# Patient Record
Sex: Male | Born: 2005 | Race: Black or African American | Hispanic: No | Marital: Single | Smoking: Current every day smoker
Health system: Southern US, Community
[De-identification: ages and names within clinical notes are randomized; demographics above are authoritative.]

## PROBLEM LIST (undated history)

## (undated) DIAGNOSIS — S83512S Sprain of anterior cruciate ligament of left knee, sequela: Secondary | ICD-10-CM

## (undated) HISTORY — PX: INGUINAL HERNIA REPAIR: SUR1180

---
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
## 2023-11-19 ENCOUNTER — Ambulatory Visit (HOSPITAL_COMMUNITY)
Admission: RE | Admit: 2023-11-19 | Discharge: 2023-11-19 | Disposition: A | Discharge status: Home / Self Care | Payer: Medicaid Other | Source: Ambulatory Visit | Attending: Emergency Medicine | Admitting: Emergency Medicine

## 2023-11-19 ENCOUNTER — Other Ambulatory Visit: Payer: Self-pay

## 2023-11-19 ENCOUNTER — Encounter (HOSPITAL_COMMUNITY): Payer: Self-pay

## 2023-11-19 VITALS — BP 124/75 | HR 79 | Temp 98.1°F | Resp 16

## 2023-11-19 DIAGNOSIS — Z113 Encounter for screening for infections with a predominantly sexual mode of transmission: Secondary | ICD-10-CM | POA: Diagnosis present

## 2023-11-19 LAB — HIV ANTIBODY (ROUTINE TESTING W REFLEX): HIV Screen 4th Generation wRfx: NONREACTIVE

## 2023-11-19 NOTE — Discharge Instructions (Signed)
Your results were returned over the next few days and someone will call you if results are positive and require treatment.  Return here as needed.
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
## 2023-11-20 LAB — RPR: RPR Ser Ql: NONREACTIVE

## 2023-11-22 ENCOUNTER — Telehealth (HOSPITAL_COMMUNITY): Payer: Self-pay

## 2023-11-22 LAB — CYTOLOGY, (ORAL, ANAL, URETHRAL) ANCILLARY ONLY
Chlamydia: NEGATIVE
Comment: NEGATIVE
Comment: NEGATIVE
Comment: NORMAL
Neisseria Gonorrhea: NEGATIVE
Trichomonas: POSITIVE — AB

## 2023-11-22 MED ORDER — METRONIDAZOLE 500 MG PO TABS: 2000.0000 mg | ORAL_TABLET | Freq: Once | ORAL | 0 refills | Status: AC

## 2023-11-22 NOTE — Telephone Encounter (Signed)
 Per protocol, pt requires tx with metronidazole. Attempted to reach patient x1. LVM.  Rx sent to pharmacy on file.

## 2024-05-02 ENCOUNTER — Ambulatory Visit (HOSPITAL_COMMUNITY): Admission: EM | Admit: 2024-05-02 | Discharge: 2024-05-03 | Attending: Psychiatry | Admitting: Psychiatry

## 2024-05-02 DIAGNOSIS — F913 Oppositional defiant disorder: Secondary | ICD-10-CM | POA: Insufficient documentation

## 2024-05-02 DIAGNOSIS — R4689 Other symptoms and signs involving appearance and behavior: Secondary | ICD-10-CM

## 2024-05-02 DIAGNOSIS — R45850 Homicidal ideations: Secondary | ICD-10-CM | POA: Insufficient documentation

## 2024-05-02 DIAGNOSIS — Z9151 Personal history of suicidal behavior: Secondary | ICD-10-CM | POA: Insufficient documentation

## 2024-05-02 DIAGNOSIS — F32A Depression, unspecified: Secondary | ICD-10-CM | POA: Diagnosis not present

## 2024-05-02 DIAGNOSIS — F29 Unspecified psychosis not due to a substance or known physiological condition: Secondary | ICD-10-CM | POA: Insufficient documentation

## 2024-05-02 DIAGNOSIS — F1290 Cannabis use, unspecified, uncomplicated: Secondary | ICD-10-CM | POA: Insufficient documentation

## 2024-05-02 DIAGNOSIS — R451 Restlessness and agitation: Secondary | ICD-10-CM | POA: Insufficient documentation

## 2024-05-02 DIAGNOSIS — R454 Irritability and anger: Secondary | ICD-10-CM | POA: Insufficient documentation

## 2024-05-02 DIAGNOSIS — F909 Attention-deficit hyperactivity disorder, unspecified type: Secondary | ICD-10-CM | POA: Diagnosis not present

## 2024-05-02 LAB — POCT URINE DRUG SCREEN - MANUAL ENTRY (I-SCREEN)
POC Amphetamine UR: NOT DETECTED
POC Buprenorphine (BUP): NOT DETECTED
POC Cocaine UR: NOT DETECTED
POC Marijuana UR: POSITIVE — AB
POC Methadone UR: NOT DETECTED
POC Methamphetamine UR: NOT DETECTED
POC Morphine: NOT DETECTED
POC Oxazepam (BZO): NOT DETECTED
POC Oxycodone UR: NOT DETECTED
POC Secobarbital (BAR): NOT DETECTED

## 2024-05-02 LAB — URINALYSIS, ROUTINE W REFLEX MICROSCOPIC
Bacteria, UA: NONE SEEN
Bilirubin Urine: NEGATIVE
Glucose, UA: NEGATIVE mg/dL
Ketones, ur: NEGATIVE mg/dL
Leukocytes,Ua: NEGATIVE
Nitrite: NEGATIVE
Protein, ur: NEGATIVE mg/dL
Specific Gravity, Urine: 1.005 (ref 1.005–1.030)
pH: 7 (ref 5.0–8.0)

## 2024-05-02 MED ORDER — DIPHENHYDRAMINE HCL 50 MG/ML IJ SOLN
50.0000 mg | Freq: Three times a day (TID) | INTRAMUSCULAR | Status: DC | PRN
  Administered 2024-05-02: 50 mg via INTRAMUSCULAR
  Filled 2024-05-02: qty 1

## 2024-05-02 MED ORDER — ALUM & MAG HYDROXIDE-SIMETH 200-200-20 MG/5ML PO SUSP: 30.0000 mL | ORAL | Status: DC | PRN

## 2024-05-02 MED ORDER — LORAZEPAM 2 MG/ML IJ SOLN
2.0000 mg | Freq: Three times a day (TID) | INTRAMUSCULAR | Status: DC | PRN
  Filled 2024-05-02: qty 1

## 2024-05-02 MED ORDER — ACETAMINOPHEN 325 MG PO TABS: 650.0000 mg | ORAL_TABLET | Freq: Four times a day (QID) | ORAL | Status: DC | PRN

## 2024-05-02 MED ORDER — MAGNESIUM HYDROXIDE 400 MG/5ML PO SUSP: 30.0000 mL | Freq: Every day | ORAL | Status: DC | PRN

## 2024-05-02 MED ORDER — LORAZEPAM 2 MG/ML IJ SOLN
2.0000 mg | Freq: Three times a day (TID) | INTRAMUSCULAR | Status: DC | PRN
  Administered 2024-05-02: 2 mg via INTRAMUSCULAR

## 2024-05-02 MED ORDER — DIPHENHYDRAMINE HCL 50 MG PO CAPS: 50.0000 mg | ORAL_CAPSULE | Freq: Three times a day (TID) | ORAL | Status: DC | PRN

## 2024-05-02 MED ORDER — HALOPERIDOL LACTATE 5 MG/ML IJ SOLN
10.0000 mg | Freq: Three times a day (TID) | INTRAMUSCULAR | Status: DC | PRN
  Administered 2024-05-02: 10 mg via INTRAMUSCULAR
  Filled 2024-05-02: qty 2

## 2024-05-02 MED ORDER — OLANZAPINE 5 MG PO TABS
5.0000 mg | ORAL_TABLET | Freq: Every day | ORAL | Status: DC
Start: 2024-05-02 — End: 2024-05-03
  Administered 2024-05-02: 5 mg via ORAL
  Filled 2024-05-02: qty 1

## 2024-05-02 MED ORDER — DIPHENHYDRAMINE HCL 50 MG/ML IJ SOLN: 50.0000 mg | Freq: Three times a day (TID) | INTRAMUSCULAR | Status: DC | PRN

## 2024-05-02 MED ORDER — HALOPERIDOL 5 MG PO TABS
5.0000 mg | ORAL_TABLET | Freq: Three times a day (TID) | ORAL | Status: DC | PRN
  Administered 2024-05-03: 5 mg via ORAL
  Filled 2024-05-02: qty 1

## 2024-05-02 MED ORDER — HALOPERIDOL LACTATE 5 MG/ML IJ SOLN: 5.0000 mg | Freq: Three times a day (TID) | INTRAMUSCULAR | Status: DC | PRN

## 2024-05-02 NOTE — Progress Notes (Signed)
 Inpatient Psychiatric Referral  Patient was recommended inpatient per Jasmine Levy, NP. There are no available beds at [facility], per [facility] Cherylynn Ernst, RN. Patient was referred to the following out of network facilities:  Destination  Service Provider Request Status Address Phone Fax  [facility]  Pending - Request Sent [address], [place] [address] [phone]  [facility]  Pending - Request Sent [address] [place] [address] [phone]  [facility]  Pending - Request Sent [address] [place] [address] [phone]  [facility]  Pending - Request Sent [address], [place] [address] [phone]  [facility]  Pending - Request Sent [address] [place] [address] [phone]  [facility]  Pending - Request Sent [address] [place] [phone]  [facility]  Pending - Request Sent [address] [place] [address] [phone]  [facility]  Pending - Request Sent [address], [place] [address] [phone]    Situation ongoing, CSW to continue following and update chart as more information becomes available.   Harrie Sofia MSW, LCSWA 05/02/2024  6:09 PM

## 2024-05-02 NOTE — Progress Notes (Signed)
 Pt has been accepted to [facility] on 05/02/2024 Bed assignment: Main campus  Pt meets inpatient criteria per Rolin Lipps, NP   Attending Physician will be Millie Manners, MD  Report can be called to: [phone] (this is a pager, please leave call-back number when giving report)  Pt can arrive after 8 AM  Care Team Notified:  Orene Carte, RN

## 2024-05-02 NOTE — ED Provider Notes (Cosign Needed Addendum)
 [facility] Continuous Assessment Admission H&P  Date: 05/02/24 Patient Name: Dustin Carlson MRN: [id number] Chief Complaint: Per triage note: Dustin Carlson presents to [facility] voluntarily accompanied by his mother. Pt states that his mother feels that he needs to talk to someone. Pt states that he has been breaking stuff in the home because they be getting on his nerves. Pt currently denies SI, AVH and alcohol/drug use. Pt shares that he had SI thoughts 2 months ago, and sometimes still thinks about it. Pt shares he attempted SI by cutting himself in 2023. Pt states that he thinks about HI everyday and still endorses HI at this time. Pt shares that his last drink was July 1 this year.   Diagnoses:  Final diagnoses:  Psychosis, unspecified psychosis type (HCC)  Aggressive behavior of adult    HPI: Dustin Carlson, 18 y.o., male  with a reported history of ADHD, ODD and MDD , presented to [facility] voluntarily as a walk-in accompanied by his mother, [name], with complaints of increased agitation and aggression. Patient seen face to face by this provider, consulted with Dr. Cole; and chart reviewed on 05/02/24. Patient assessment was unachievable as patient was unable to participate in the process due to significant agitation and aggression. Upon evaluation, patient was verbally aggressive to staff by use of profanity and threatening statements. Patient presents are irritable and agitated, mood congruent with affect. He presents with poor eye contact and is speaking and yelling at a loud volume. Unable to assess suicidal ideation, self-harm, homicidal ideation, auditory hallucinations, and/or visual hallucinations. Objectively, it appears that patient may be responding to internal stimuli as evidenced by self-dialogue at a minimal volume.   Collateral obtained via mother, [name], during in person assessment: Per patient's mother, she reports that she has  noticed patient has become increasingly aggressive and agitated over the last few weeks and feels this is the breaking point. She reports anger outbursts have increased in frequency (previously about once ever 3 months and is now multiple times per week). She reports patient experiences anger outbursts that are disproportionate to the stressor. She reports an incident occurred this past weekend in which patient became upset regarding his laundry and this resulted in him throwing fans and breaking multiple televisions in the home. Per mother, patient laughed inappropriately after and appeared to show no remorse for his actions. She also reports patient presents as invincible, untouchable, and like he runs the world. She reports patient's brother was recently incarcerated about a week ago and this has been a major stressor for patient as they are very close and have never been apart. She reports patient has reported hearing voices in the past, but did not disclose the content. Per mother, she reports patient has been sleeping poorly and staying up all night talking to himself. She reports past history of visual hallucinations, unaware of content. Mother unaware of suicidal ideation or attempts, however per triage note, patient reports passive SI and suicide attempt via cutting in 2023. Mother also reports paranoid delusions, as patient believes the family and others are conspiring against him and trying to harm him. She reports patient voiced concern that someone was trying to harm him while driving on the way here and stated I'll jump out of this car right now.  She reports that patient has a history of behavioral concerns since childhood (tearing up the class, breaking things, scratching his face, eyes rolling in his head) and difficulty  controlling his anger. She reports he received a diagnosis of ADHD, ODD and depression and was prescribed Zoloft (unsure of dosage) around age [age] years old, but  discontinued due to increased agitation, anger and aggression. Mother also reports a previous psychiatric provider mentioned early stage schizophrenia. Per mother, there is a strong family history of schizophrenia and bipolar disorder. Regarding substance use, mother reports patient smokes cannabis frequently. She reports patient does not have any medical conditions or concerns. Mother denies physical aggression towards others, endorses aggression towards property. She reports patient has access to firearms in the home.   Total Time spent with patient: 1.5 hours  Musculoskeletal  Strength & Muscle Tone: within normal limits Gait & Station: normal Patient leans: N/A  Psychiatric Specialty Exam  Presentation General Appearance: Fairly Groomed  Eye Contact:Poor  Speech:Pressured  Speech Volume:Increased   Mood and Affect  Mood:Angry; Irritable  Affect:Congruent; Labile   Thought Process  Thought Processes:Other (comment) (UTA)  Descriptions of Associations:-- (UTA)  Orientation:Other (comment) (UTA)  Thought Content:Other (comment) (UTA)    Hallucinations:Hallucinations: Other (comment) (UTA)  Ideas of Reference:-- (UTA)  Suicidal Thoughts:Suicidal Thoughts: -- (UTA)  Homicidal Thoughts:Homicidal Thoughts: -- (UTA)   Sensorium  Memory:Other (comment) (UTA)  Judgment:Other (comment) (UTA)  Insight:Other (comment) (UTA)   Executive Functions  Concentration:Other (comment) (UTA)  Attention Span:Other (comment) (UTA)  Recall:Other (comment) (UTA)  Fund of Knowledge:Other (comment) (UTA)  Language:Other (comment) (UTA)   Psychomotor Activity  Psychomotor Activity:Psychomotor Activity: Increased; Restlessness; Other (comment) (agitation)   Assets  Assets:Social Support; Housing   Sleep  Sleep:Sleep: -- (UTA) Number of Hours of Sleep: -- (UTA)   Nutritional Assessment (For OBS and [facility] admissions only) Has the patient had a weight loss or gain of  10 pounds or more in the last 3 months?: -- (UTA) Has the patient had a decrease in food intake/or appetite?: -- (UTA) Does the patient have dental problems?: -- (UTA) Does the patient have eating habits or behaviors that may be indicators of an eating disorder including binging or inducing vomiting?: -- (UTA) Has the patient recently lost weight without trying?: -- (UTA) Has the patient been eating poorly because of a decreased appetite?: -- (UTA)    Physical Exam Musculoskeletal:        General: Normal range of motion.  Skin:    General: Skin is warm and dry.  Neurological:     Mental Status: He is alert.  Psychiatric:        Attention and Perception: He is inattentive.        Mood and Affect: Affect is labile and angry.        Behavior: Behavior is agitated, aggressive and combative.        Judgment: Judgment is impulsive and inappropriate.    Review of Systems  Constitutional: Negative.   Skin: Negative.     Blood pressure (!) 110/59, pulse 86, temperature 98.3 F (36.8 C), temperature source Oral, resp. rate 16, SpO2 100%. There is no height or weight on file to calculate BMI.  Past Psychiatric History: Prior psychiatric diagnoses: ADHD, ODD, MDD. Per patient's mother, no hx of psychiatric hospitalizations. Hx of treatment for MDD as a child. UTA suicide attempts or non-suicidal self injury. Per patient's mother, patient does not currently see a therapist or medication provider.   Is the patient at risk to self? No  Has the patient been a risk to self in the past 6 months? No .    Has the patient been a  risk to self within the distant past? Yes   Is the patient a risk to others? No  Has the patient been a risk to others in the past 6 months? No   Has the patient been a risk to others within the distant past? No   Past Medical History: None   Family History: Schizophrenia (maternal grandmother, maternal aunt), bipolar disorder (maternal aunt, maternal cousin)  Social  History: Lives at home with mother and siblings.    Last Labs:  Admission on 11/19/2023, Discharged on 11/19/2023  Component Date Value Ref Range Status   Neisseria Gonorrhea 11/19/2023 Negative   Final   Chlamydia 11/19/2023 Negative   Final   Trichomonas 11/19/2023 Positive (A)   Final   Comment 11/19/2023 Normal Reference Range Trichomonas - Negative   Final   Comment 11/19/2023 Normal Reference Ranger Chlamydia - Negative   Final   Comment 11/19/2023 Normal Reference Range Neisseria Gonorrhea - Negative   Final   HIV Screen 4th Generation wRfx 11/19/2023 Non Reactive  Non Reactive Final   Performed at [facility], [address] [place], [place] [address]   RPR Ser Ql 11/19/2023 NON REACTIVE  NON REACTIVE Final   Performed at [facility], [address] [place], [place] [address]    Allergies: Bee venom and Other  Medications:  Facility Ordered Medications  Medication   haloperidol  (HALDOL ) tablet 5 mg   And   diphenhydrAMINE  (BENADRYL ) capsule 50 mg   haloperidol  lactate (HALDOL ) injection 5 mg   And   diphenhydrAMINE  (BENADRYL ) injection 50 mg   And   LORazepam  (ATIVAN ) injection 2 mg   haloperidol  lactate (HALDOL ) injection 10 mg   And   diphenhydrAMINE  (BENADRYL ) injection 50 mg   And   LORazepam  (ATIVAN ) injection 2 mg   acetaminophen  (TYLENOL ) tablet 650 mg   alum & mag hydroxide-simeth (MAALOX/MYLANTA) 200-200-20 MG/5ML suspension 30 mL   magnesium  hydroxide (MILK OF MAGNESIA) suspension 30 mL     Medical Decision Making  Patient presents with symptoms concerning for psychosis and possible mania. Patient presented voluntarily, but was petitioned for involuntary commitment status. Awaiting response from magistrate.   PLAN:  --Admission: Patient meets criteria for inpatient treatment for mood stabilization, continuous monitoring, medication management and safety. Will notify [facility] and LCSW to locate appropriate placement. Patient will   --Medical Interventions:     -Labs: CBC, CMP, Lipid panel, HbA1c, TSH, Urine drug screen, Ethanol , and EKG --Psychiatric Interventions:      -Agitation Protocol     -Olanzapine  5 mg at bedtime to target symptoms of psychosis   Recommendations  Based on my evaluation the patient does not appear to have an emergency medical condition.  Zakayla Martinec A Iver Miklas, NP 05/02/24  4:11 PM

## 2024-05-02 NOTE — Progress Notes (Signed)
   05/02/24 1406  [facility] Triage Screening (Walk-ins at [facility] only)  How Did You Hear About Us ? Family/Friend  What Is the Reason for Your Visit/Call Today? Dustin Carlson presents to [facility] voluntarily accompanied by his mother. Pt states that his mother feels that he needs to talk to someone. Pt states that he has been breaking stuff in the home because they be getting on his nerves. Pt currently denies SI, AVH and alcohol/drug use. Pt shares that he had SI thoughts 2 months ago, and sometimes still thinks about it. Pt shares he attempted SI by cutting himself in 2023. Pt states that he thinks about HI everyday and still endorses HI at this time. Pt shares that his last drink was July 1 this year.  How Long Has This Been Causing You Problems? <Week  Have You Recently Had Any Thoughts About Hurting Yourself? No  Are You Planning to Commit Suicide/Harm Yourself At This time?  (sometimes)  Have you Recently Had Thoughts About Hurting Someone Sherral? Yes  How long ago did you have thoughts of harming others? everyday  Are You Planning To Harm Someone At This Time? Yes  Physical Abuse Denies  Verbal Abuse Denies  Sexual Abuse Denies  Exploitation of patient/patient's resources Denies  Are you currently experiencing any auditory, visual or other hallucinations? No  Have You Used Any Alcohol or Drugs in the Past 24 Hours? No  Do you have any current medical co-morbidities that require immediate attention? No  Clinician description of patient physical appearance/behavior: irritated, giggling  What Do You Feel Would Help You the Most Today? Social Support  If access to [facility] was not available, would you have sought care in the Emergency Department? No  Determination of Need Urgent (48 hours)  Options For Referral Medication Management;Inpatient Hospitalization;Outpatient Therapy  Determination of Need filed? Yes
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
## 2024-05-02 NOTE — ED Notes (Addendum)
 Pt BP 97/51. Gatorade and snack given to pt although he reports not being hungry or thirsty. He drank a full cup of water with his medication. NP notified and no new orders at this time. He is safe on the unit at this time with continuous observation in place.
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
## 2024-05-02 NOTE — ED Notes (Addendum)
 Pt is seen in bed eyes closed with unlabored breaths. UTA SI/HI/AVH at this time. He safe on the unit at this time with continuous observation in place.
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
## 2024-05-03 DIAGNOSIS — R4689 Other symptoms and signs involving appearance and behavior: Secondary | ICD-10-CM

## 2024-05-03 DIAGNOSIS — F29 Unspecified psychosis not due to a substance or known physiological condition: Secondary | ICD-10-CM

## 2024-05-03 NOTE — ED Notes (Signed)
 Patient resting quietly in bed with eyes closed. Unlabored breathing. NAD noted. Facility protocol safety checks in place. Pt is currently safe on the unit.

## 2024-05-03 NOTE — ED Provider Notes (Signed)
 [facility]BC/OBS ASAP Discharge Summary  Date and Time: 05/03/2024 3:44 PM  Name: Dustin Carlson  MRN:  [id number]   Discharge Diagnoses:  Final diagnoses:  Psychosis, unspecified psychosis type (HCC)  Aggressive behavior of adult    Subjective: Pt this morning was resting in bed. Sleep was alright, Anxiety rated 0/10, Depression 2/10. Denies SI, HI, and VH. Denies AH today saying he hears them sometimes when I'm by myself. Only complaint is that he doesn't want to be IVCd and he wants to go home.  Stay Summary: Pt arrived 05/02/2024 voluntarily with mother who brought him in for episodes of aggressive, destructive rage that have been getting more frequent to now occurring a few times a week. After arrival, pt became agitated, threw a table, and a physical hold was required to administer medication and pt was placed in a restraint chair. After removal from the restraint chair, pt voluntarily took medication and slept through the night. Pt was placed on IVC and did not like this, but was not aggressive toward staff in the morning and was taken to [facility] by sheriff.   Total Time spent with patient: 20 minutes  Past Psychiatric History: Prior psychiatric diagnoses: ADHD, ODD, MDD. Per patient's mother, no hx of psychiatric hospitalizations. Hx of treatment for MDD as a child. UTA suicide attempts or non-suicidal self injury. Per patient's mother, patient does not currently see a therapist or medication provider. Family Psychiatric History: Schizophrenia (maternal grandmother, maternal aunt), bipolar disorder (maternal aunt, maternal cousin) Social History: Lives at home with mother and siblings  Current Medications:  Current Facility-Administered Medications  Medication Dose Route Frequency Provider Last Rate Last Admin   acetaminophen  (TYLENOL ) tablet 650 mg  650 mg Oral Q6H PRN Levy, Jasmine A, NP       alum & mag hydroxide-simeth (MAALOX/MYLANTA) 200-200-20 MG/5ML suspension 30 mL  30 mL Oral Q4H PRN  Levy, Jasmine A, NP       haloperidol  (HALDOL ) tablet 5 mg  5 mg Oral TID PRN Levy, Jasmine A, NP   5 mg at 05/03/24 1045   And   diphenhydrAMINE  (BENADRYL ) capsule 50 mg  50 mg Oral TID PRN Levy, Jasmine A, NP       haloperidol  lactate (HALDOL ) injection 5 mg  5 mg Intramuscular TID PRN Levy, Jasmine A, NP       And   diphenhydrAMINE  (BENADRYL ) injection 50 mg  50 mg Intramuscular TID PRN Levy, Jasmine A, NP       And   LORazepam  (ATIVAN ) injection 2 mg  2 mg Intramuscular TID PRN Levy, Jasmine A, NP       haloperidol  lactate (HALDOL ) injection 10 mg  10 mg Intramuscular TID PRN Levy, Jasmine A, NP   10 mg at 05/02/24 1500   And   diphenhydrAMINE  (BENADRYL ) injection 50 mg  50 mg Intramuscular TID PRN Levy, Jasmine A, NP   50 mg at 05/02/24 1500   And   LORazepam  (ATIVAN ) injection 2 mg  2 mg Intramuscular TID PRN Levy, Jasmine A, NP   2 mg at 05/02/24 1500   magnesium  hydroxide (MILK OF MAGNESIA) suspension 30 mL  30 mL Oral Daily PRN Levy, Jasmine A, NP       OLANZapine  (ZYPREXA ) tablet 5 mg  5 mg Oral QHS Levy, Jasmine A, NP   5 mg at 05/02/24 2127   No current outpatient medications on file.    PTA Medications:  Facility Ordered Medications  Medication   haloperidol  (HALDOL ) tablet  5 mg   And   diphenhydrAMINE  (BENADRYL ) capsule 50 mg   haloperidol  lactate (HALDOL ) injection 5 mg   And   diphenhydrAMINE  (BENADRYL ) injection 50 mg   And   LORazepam  (ATIVAN ) injection 2 mg   haloperidol  lactate (HALDOL ) injection 10 mg   And   diphenhydrAMINE  (BENADRYL ) injection 50 mg   And   LORazepam  (ATIVAN ) injection 2 mg   acetaminophen  (TYLENOL ) tablet 650 mg   alum & mag hydroxide-simeth (MAALOX/MYLANTA) 200-200-20 MG/5ML suspension 30 mL   magnesium  hydroxide (MILK OF MAGNESIA) suspension 30 mL   OLANZapine  (ZYPREXA ) tablet 5 mg        No data to display          Flowsheet Row ED from 05/02/2024 in [facility] from 11/19/2023 in [facility] from 09/12/2022 in [facility] RISK CATEGORY No Risk No Risk No Risk    Musculoskeletal  Strength & Muscle Tone: within normal limits Gait & Station: normal Patient leans: N/A  Psychiatric Specialty Exam  Presentation  General Appearance:  Appropriate for Environment  Eye Contact: Fair  Speech: Normal Rate  Speech Volume: Decreased  Handedness:No data recorded  Mood and Affect  Mood: Euthymic  Affect: Blunt   Thought Process  Thought Processes: Goal Directed; Coherent; Linear  Descriptions of Associations:Intact  Orientation:Full (Time, Place and Person)  Thought Content:Logical  Diagnosis of Schizophrenia or Schizoaffective disorder in past: No (Mother reports he wa told when he wws younger that he ws displaying early signs of schiophrenia)  Duration of Psychotic Symptoms: Greater than six months   Hallucinations:Hallucinations: None  Ideas of Reference:None  Suicidal Thoughts:Suicidal Thoughts: No  Homicidal Thoughts:Homicidal Thoughts: No   Sensorium  Memory: Immediate Fair; Recent Fair; Remote Fair  Judgment: Poor  Insight: Poor   Executive Functions  Concentration: Fair  Attention Span: Other (comment) (UTA)  Recall: Other (comment) (UTA)  Fund of Knowledge: Other (comment) (UTA)  Language: Other (comment) (UTA)   Psychomotor Activity  Psychomotor Activity: Psychomotor Activity: Normal   Assets  Assets: Resilience   Sleep  Sleep: Sleep: -- (alright)  No Safety Checks orders active in given range  Nutritional Assessment (For OBS and FBC admissions only) Has the patient had a weight loss or gain of 10 pounds or more in the last 3 months?: -- (UTA) Has the patient had a decrease in food intake/or appetite?: -- (UTA) Does the patient have dental problems?: -- (UTA) Does the patient have eating habits or behaviors that may be indicators of an eating  disorder including binging or inducing vomiting?: -- (UTA) Has the patient recently lost weight without trying?: -- (UTA) Has the patient been eating poorly because of a decreased appetite?: -- (UTA)    Physical Exam  Physical Exam Vitals reviewed.  Constitutional:      General: He is not in acute distress.    Appearance: He is not toxic-appearing.  Pulmonary:     Effort: Pulmonary effort is normal. No respiratory distress.  Neurological:     Mental Status: He is alert.    Review of Systems  Respiratory: Negative.    Cardiovascular: Negative.   Gastrointestinal: Negative.    Blood pressure 111/82, pulse 71, temperature 97.6 F (36.4 C), temperature source Oral, resp. rate 16, SpO2 100%. There is no height or weight on file to calculate BMI.  Demographic Factors:  Male and Adolescent or young adult  Historical Factors: Impulsivity  Risk Reduction Factors:   Positive social support  Continued Clinical Symptoms:  More than one psychiatric diagnosis  Cognitive Features That Contribute To Risk:  None    Suicide Risk:  Moderate:  Frequent suicidal ideation with limited intensity, and duration, some specificity in terms of plans, no associated intent, good self-control, limited dysphoria/symptomatology, some risk factors present, and identifiable protective factors, including available and accessible social support.  Impression: Psychosis, unspecified psychosis type  Plan Of Care/Follow-up recommendations:  Transfer to [facility] for more appropriate level of care  Disposition: Transfer to [facility] for more appropriate level of care  Penne Mori, DO 05/03/2024, 3:44 PM

## 2024-05-03 NOTE — ED Notes (Signed)
 Patient is calm at this time.  He is awake lying in bed.  Patient without distress or agitation.  Declined breakfast this morning.  Awaiting transfer to [facility].  He will go via sheriff later this afternoon.  Will monitor and provide support and safety while on unit.

## 2024-05-03 NOTE — ED Notes (Signed)
 Patient to transferred to [facility] per MD order. Patient transferred in no acute distress, A&O x4 and ambulatory. Patient mood irritable but calm at this time. Patient belongings from locker #29 given to [facility] complete and intact. Patient escorted to back [place] via staff for transport to destination. Safety maintained.

## 2024-05-03 NOTE — Discharge Instructions (Signed)

## 2024-10-30 ENCOUNTER — Encounter (HOSPITAL_COMMUNITY): Payer: Self-pay

## 2024-10-30 ENCOUNTER — Other Ambulatory Visit: Payer: Self-pay

## 2024-10-30 ENCOUNTER — Emergency Department (HOSPITAL_COMMUNITY): Payer: MEDICAID

## 2024-10-30 ENCOUNTER — Emergency Department (HOSPITAL_COMMUNITY)
Admission: EM | Admit: 2024-10-30 | Discharge: 2024-10-30 | Disposition: A | Discharge status: Home / Self Care | Payer: MEDICAID | Attending: Emergency Medicine | Admitting: Emergency Medicine

## 2024-10-30 DIAGNOSIS — S61210A Laceration without foreign body of right index finger without damage to nail, initial encounter: Secondary | ICD-10-CM | POA: Diagnosis present

## 2024-10-30 DIAGNOSIS — Z23 Encounter for immunization: Secondary | ICD-10-CM | POA: Insufficient documentation

## 2024-10-30 DIAGNOSIS — S61011A Laceration without foreign body of right thumb without damage to nail, initial encounter: Secondary | ICD-10-CM

## 2024-10-30 DIAGNOSIS — W25XXXA Contact with sharp glass, initial encounter: Secondary | ICD-10-CM | POA: Insufficient documentation

## 2024-10-30 LAB — URINE DRUG SCREEN
Amphetamines: NEGATIVE
Barbiturates: NEGATIVE
Benzodiazepines: NEGATIVE
Cocaine: NEGATIVE
Fentanyl: NEGATIVE
Methadone Scn, Ur: NEGATIVE
Opiates: NEGATIVE
Tetrahydrocannabinol: POSITIVE — AB

## 2024-10-30 MED ORDER — TETANUS-DIPHTH-ACELL PERTUSSIS 5-2-15.5 LF-MCG/0.5 IM SUSP
0.5000 mL | Freq: Once | INTRAMUSCULAR | Status: AC
  Administered 2024-10-30: 0.5 mL via INTRAMUSCULAR
  Filled 2024-10-30: qty 0.5

## 2024-10-30 NOTE — ED Provider Notes (Signed)
" °   EMERGENCY DEPARTMENT AT [facility] Provider Note   CSN: [id number] Arrival date & time: 10/30/24  1239     Patient presents with: Laceration   Dustin Carlson is a 19 y.o. male.   Dustin Carlson is a 19 y.o. male presenting for hand wound and pain after punching a TV. Patient got into a verbal argument with another person he lives with and which upset him causing him to punch the TV. He did break the glass and had immediate pain. He reports he is not in much pain now but his fingers feel a little cold.   The history is provided by the patient.  Laceration      Prior to Admission medications  Not on File    Allergies: Bee venom and Other    Review of Systems  Skin:  Positive for wound.    Updated Vital Signs BP 128/73   Pulse 83   Temp 98.4 F (36.9 C) (Oral)   Resp 15   Ht 5' 6 (1.676 m)   Wt 72.6 kg   SpO2 100%   BMI 25.82 kg/m   Physical Exam Constitutional:      Appearance: Normal appearance. He is normal weight.  Cardiovascular:     Rate and Rhythm: Normal rate and regular rhythm.  Pulmonary:     Effort: Pulmonary effort is normal.     Breath sounds: Normal breath sounds.  Skin:    Findings: Lesion present.     Comments: Multiple shallow wounds on the right hand with skin tear No foreign bodies noted in the wounds  Largest 1-2 cm over the MTP joint on the right thumb   Neurological:     Mental Status: He is alert.       (all labs ordered are listed, but only abnormal results are displayed) Labs Reviewed  URINE DRUG SCREEN - Abnormal; Notable for the following components:      Result Value   Tetrahydrocannabinol POSITIVE (*)    All other components within normal limits    EKG: None  Radiology: No results found.   Procedures   Medications Ordered in the ED  Tdap (ADACEL ) injection 0.5 mL (0.5 mLs Intramuscular Given 10/30/24 1253)                                    Medical Decision Making Dustin Carlson is a 19 y.o. male presenting for hand wound after punch a TV. Radial pulse +2 on exam with normal ROM of the fingers. Tetanus shot administered. DG hand on independent interpretation with no acute fracture. ROM intact on exam with radial pulse +2, grip strength 5/5. Due to location and depth of wound, will elect to close with dermabond and SteriStrips. Good hemostasis of wound with SteriStrips.   Patient discharged in stable condition into [employer] custody.   Amount and/or Complexity of Data Reviewed Labs: ordered. Radiology: ordered.       Final diagnoses:  Laceration of right thumb without foreign body without damage to nail, initial encounter    ED Discharge Orders     None          Cleotilde Perkins, DO 10/30/24 1408  "

## 2024-10-30 NOTE — ED Triage Notes (Signed)
 Patient punched a TV and cut his right hand. Unknown when last tetanus was.
